# Patient Record
Sex: Male | Born: 1975
Health system: Southern US, Community
[De-identification: ages and names within clinical notes are randomized; demographics above are authoritative.]

## PROBLEM LIST (undated history)

## (undated) DIAGNOSIS — E7800 Pure hypercholesterolemia, unspecified: Secondary | ICD-10-CM

## (undated) DIAGNOSIS — R7303 Prediabetes: Secondary | ICD-10-CM

## (undated) HISTORY — PX: APPENDECTOMY: SHX54

---

## 2015-07-29 ENCOUNTER — Ambulatory Visit (INDEPENDENT_AMBULATORY_CARE_PROVIDER_SITE_OTHER): Payer: BLUE CROSS/BLUE SHIELD | Admitting: Family Medicine

## 2015-07-29 VITALS — BP 120/80 | HR 68 | Temp 98.2°F | Resp 16 | Ht 70.0 in | Wt 159.0 lb

## 2015-07-29 DIAGNOSIS — Z0000 Encounter for general adult medical examination without abnormal findings: Secondary | ICD-10-CM | POA: Diagnosis not present

## 2015-07-29 DIAGNOSIS — Z136 Encounter for screening for cardiovascular disorders: Secondary | ICD-10-CM

## 2015-07-29 DIAGNOSIS — Z1383 Encounter for screening for respiratory disorder NEC: Secondary | ICD-10-CM

## 2015-07-29 DIAGNOSIS — Z1389 Encounter for screening for other disorder: Secondary | ICD-10-CM

## 2015-07-29 DIAGNOSIS — E7800 Pure hypercholesterolemia, unspecified: Secondary | ICD-10-CM | POA: Diagnosis not present

## 2015-07-29 DIAGNOSIS — Z130 Encounter for screening for diseases of the blood and blood-forming organs and certain disorders involving the immune mechanism: Secondary | ICD-10-CM

## 2015-07-29 DIAGNOSIS — Z23 Encounter for immunization: Secondary | ICD-10-CM

## 2015-07-29 DIAGNOSIS — Z1329 Encounter for screening for other suspected endocrine disorder: Secondary | ICD-10-CM

## 2015-07-29 LAB — COMPREHENSIVE METABOLIC PANEL
ALT: 70 U/L — ABNORMAL HIGH (ref 9–46)
AST: 41 U/L — ABNORMAL HIGH (ref 10–40)
Albumin: 4 g/dL (ref 3.6–5.1)
Alkaline Phosphatase: 89 U/L (ref 40–115)
BUN: 12 mg/dL (ref 7–25)
CHLORIDE: 102 mmol/L (ref 98–110)
CO2: 28 mmol/L (ref 20–31)
Calcium: 9 mg/dL (ref 8.6–10.3)
Creat: 0.86 mg/dL (ref 0.60–1.35)
GLUCOSE: 92 mg/dL (ref 65–99)
POTASSIUM: 4.4 mmol/L (ref 3.5–5.3)
Sodium: 138 mmol/L (ref 135–146)
Total Bilirubin: 1.1 mg/dL (ref 0.2–1.2)
Total Protein: 7.3 g/dL (ref 6.1–8.1)

## 2015-07-29 LAB — CBC
HEMATOCRIT: 45.1 % (ref 38.5–50.0)
HEMOGLOBIN: 15.4 g/dL (ref 13.2–17.1)
MCH: 27.9 pg (ref 27.0–33.0)
MCHC: 34.1 g/dL (ref 32.0–36.0)
MCV: 81.7 fL (ref 80.0–100.0)
MPV: 10 fL (ref 7.5–12.5)
Platelets: 227 10*3/uL (ref 140–400)
RBC: 5.52 MIL/uL (ref 4.20–5.80)
RDW: 14 % (ref 11.0–15.0)
WBC: 4.5 10*3/uL (ref 3.8–10.8)

## 2015-07-29 LAB — LIPID PANEL
CHOL/HDL RATIO: 6.6 ratio — AB (ref <=5.0)
Cholesterol: 177 mg/dL (ref 125–200)
HDL: 27 mg/dL — AB (ref >=40)
LDL CALC: 113 mg/dL (ref <130)
TRIGLYCERIDES: 184 mg/dL — AB (ref <150)
VLDL: 37 mg/dL — AB (ref <30)

## 2015-07-29 LAB — TSH: TSH: 1.5 m[IU]/L (ref 0.40–4.50)

## 2015-07-29 NOTE — Progress Notes (Signed)
Subjective:  By signing my name below, I, Ralph Peters, attest that this documentation has been prepared under the direction and in the presence of Norberto SorensonEva Shaw, MD. Electronically Signed: Stann Oresung-Kai Peters, Scribe. 07/29/2015 , 11:14 AM .  Patient was seen in Room 12 .   Patient ID: Ralph Peters, male    DOB: 06/19/1975, 40 y.o.   MRN: [id number] Chief Complaint  Patient presents with  . bloodwork for cholesterol   HPI Ralph Peters is a 40 y.o. male who presents to [facility] for annual physical. He's fasting today.  He denies much exercising. He informs drinking about 12 beers a week, mostly over the weekend. He denies urinary symptoms, constipation, diarrhea, nausea, or abdominal pain.   He mentions having history of slightly elevated cholesterol in previous blood work.  His father has enlarged prostate.   History reviewed. No pertinent past medical history. Prior to Admission medications   Not on File   Allergies  Allergen Reactions  . Advil [Ibuprofen] Rash    Review of Systems  Constitutional: Negative for fever, chills and fatigue.  Gastrointestinal: Negative for nausea, vomiting, abdominal pain, diarrhea and constipation.  Endocrine: Negative for polyuria.  Genitourinary: Negative for dysuria, urgency, frequency and hematuria.       Objective:   Physical Exam  Constitutional: He is oriented to person, place, and time. He appears well-developed and well-nourished. No distress.  HENT:  Head: Normocephalic and atraumatic.  Right Ear: Tympanic membrane is retracted.  Left Ear: Tympanic membrane is retracted.  Nose: Nose normal.  Mouth/Throat: Oropharynx is clear and moist.  Eyes: EOM are normal. Pupils are equal, round, and reactive to light.  Neck: Neck supple. No thyromegaly present.  Cardiovascular: Normal rate.   Pulmonary/Chest: Effort normal. No respiratory distress.  Abdominal: Bowel sounds are normal.  Musculoskeletal: Normal range of motion.    Lymphadenopathy:    He has no cervical adenopathy.  Neurological: He is alert and oriented to person, place, and time.  Skin: Skin is warm and dry.  Psychiatric: He has a normal mood and affect. His behavior is normal.  Nursing note and vitals reviewed.   BP 120/80 mmHg  Pulse 68  Temp(Src) 98.2 F (36.8 C) (Oral)  Resp 16  Ht 5\' 10"  (1.778 m)  Wt 159 lb (72.122 kg)  BMI 22.81 kg/m2  SpO2 99%    Assessment & Plan:   1. Annual physical exam   2. Screening for cardiovascular, respiratory, and genitourinary diseases   3. Screening for deficiency anemia   4. Screening for thyroid disorder   5. Need for prophylactic vaccination with combined diphtheria-tetanus-pertussis (DTP) vaccine   6. Elevated cholesterol     Orders Placed This Encounter  Procedures  . Tdap vaccine greater than or equal to 7yo IM  . CBC  . Lipid panel    Order Specific Question:  Has the patient fasted?    Answer:  Yes  . Comprehensive metabolic panel    Order Specific Question:  Has the patient fasted?    Answer:  Yes  . TSH  . Care order/instruction    AVS - please print after vaccine is given  . POCT urinalysis dipstick     I personally performed the services described in this documentation, which was scribed in my presence. The recorded information has been reviewed and considered, and addended by me as needed.   Norberto SorensonEva Shaw, M.D.  [facility]  [place] [address] [place], [place] [address] [phone]  phone [phone] fax  08/12/2015 1:47 AM

## 2015-07-29 NOTE — Patient Instructions (Addendum)
   IF you received an x-ray today, you will receive an invoice from [facility]. Please contact  Radiology at [phone] with questions or concerns regarding your invoice.   IF you received labwork today, you will receive an invoice from [facility]. Please contact [facility] at [phone] with questions or concerns regarding your invoice.   Our billing staff will not be able to assist you with questions regarding bills from these companies.  You will be contacted with the lab results as soon as they are available. The fastest way to get your results is to activate your My Chart account. Instructions are located on the last page of this paperwork. If you have not heard from us regarding the results in 2 weeks, please contact this office.    Keeping you healthy  Get these tests  Blood pressure- Have your blood pressure checked once a year by your healthcare provider.  Normal blood pressure is 120/80.  Weight- Have your body mass index (BMI) calculated to screen for obesity.  BMI is a measure of body fat based on height and weight. You can also calculate your own BMI at www.nhlbisupport.com/bmi/.  Cholesterol- Have your cholesterol checked regularly starting at age 35, sooner may be necessary if you have diabetes, high blood pressure, if a family member developed heart diseases at an early age or if you smoke.   Chlamydia, HIV, and other sexual transmitted disease- Get screened each year until the age of 25 then within three months of each new sexual partner.  Diabetes- Have your blood sugar checked regularly if you have high blood pressure, high cholesterol, a family history of diabetes or if you are overweight.  Get these vaccines  Flu shot- Every fall.  Tetanus shot- Every 10 years.  Menactra- Single dose; prevents meningitis.  Take these steps  Don't smoke- If you do smoke, ask your healthcare provider about quitting. For tips on  how to quit, go to www.smokefree.gov or call 1-800-QUIT-NOW.  Be physically active- Exercise 5 days a week for at least 30 minutes.  If you are not already physically active start slow and gradually work up to 30 minutes of moderate physical activity.  Examples of moderate activity include walking briskly, mowing the yard, dancing, swimming bicycling, etc.  Eat a healthy diet- Eat a variety of healthy foods such as fruits, vegetables, low fat milk, low fat cheese, yogurt, lean meats, poultry, fish, beans, tofu, etc.  For more information on healthy eating, go to www.thenutritionsource.org  Drink alcohol in moderation- Limit alcohol intake two drinks or less a day.  Never drink and drive.  Dentist- Brush and floss teeth twice daily; visit your dentis twice a year.  Depression-Your emotional health is as important as your physical health.  If you're feeling down, losing interest in things you normally enjoy please talk with your healthcare provider.  Gun Safety- If you keep a gun in your home, keep it unloaded and with the safety lock on.  Bullets should be stored separately.  Helmet use- Always wear a helmet when riding a motorcycle, bicycle, rollerblading or skateboarding.  Safe sex- If you may be exposed to a sexually transmitted infection, use a condom  Seat belts- Seat bels can save your life; always wear one.  Smoke/Carbon Monoxide detectors- These detectors need to be installed on the appropriate level of your home.  Replace batteries at least once a year.  Skin Cancer- When out in the sun, cover up and use sunscreen SPF   15 or higher.  Violence- If anyone is threatening or hurting you, please tell your healthcare provider. 

## 2015-07-30 ENCOUNTER — Encounter: Payer: Self-pay | Admitting: Family Medicine

## 2017-03-11 ENCOUNTER — Emergency Department (HOSPITAL_BASED_OUTPATIENT_CLINIC_OR_DEPARTMENT_OTHER)
Admission: EM | Admit: 2017-03-11 | Discharge: 2017-03-11 | Disposition: A | Discharge status: Home / Self Care | Payer: BLUE CROSS/BLUE SHIELD | Attending: Emergency Medicine | Admitting: Emergency Medicine

## 2017-03-11 ENCOUNTER — Emergency Department (HOSPITAL_BASED_OUTPATIENT_CLINIC_OR_DEPARTMENT_OTHER): Payer: BLUE CROSS/BLUE SHIELD

## 2017-03-11 ENCOUNTER — Encounter (HOSPITAL_BASED_OUTPATIENT_CLINIC_OR_DEPARTMENT_OTHER): Payer: Self-pay | Admitting: Emergency Medicine

## 2017-03-11 DIAGNOSIS — M545 Low back pain, unspecified: Secondary | ICD-10-CM

## 2017-03-11 HISTORY — DX: Pure hypercholesterolemia, unspecified: E78.00

## 2017-03-11 MED ORDER — METHOCARBAMOL 500 MG PO TABS: 1000.0000 mg | ORAL_TABLET | Freq: Four times a day (QID) | ORAL | 0 refills | Status: DC

## 2017-03-11 MED ORDER — ACETAMINOPHEN 325 MG PO TABS
650.0000 mg | ORAL_TABLET | Freq: Once | ORAL | Status: AC
  Administered 2017-03-11: 650 mg via ORAL
  Filled 2017-03-11: qty 2

## 2017-03-11 MED FILL — METHOCARBAMOL 500 MG TABS: 500 | 3 days supply | Qty: 20 | Fill #0

## 2017-03-11 NOTE — ED Notes (Signed)
Patient transported to X-ray 

## 2017-03-11 NOTE — ED Triage Notes (Signed)
Pt c/o lower bilateral back pain that began yesterday and has since worsened. Pt denies incontinence, urinary symptoms, numbness, tingling, or weakness. Pt is able to ambulate but hurts.

## 2017-03-11 NOTE — Discharge Instructions (Signed)
Please read and follow all provided instructions.  Your diagnoses today include:  1. Acute midline low back pain without sciatica     Tests performed today include:  Vital signs - see below for your results today  X-ray of your back - no problems with the bones  Medications prescribed:   Robaxin (methocarbamol) - muscle relaxer medication  DO NOT drive or perform any activities that require you to be awake and alert because this medicine can make you drowsy.   Take any prescribed medications only as directed.  Home care instructions:   Follow any educational materials contained in this packet  Please rest, use ice or heat on your back for the next several days  Do not lift, push, pull anything more than 10 pounds for the next week  Follow-up instructions: Please follow-up with your primary care provider in the next 1 week for further evaluation of your symptoms.   Return instructions:  SEEK IMMEDIATE MEDICAL ATTENTION IF YOU HAVE:  New numbness, tingling, weakness, or problem with the use of your arms or legs  Severe back pain not relieved with medications  Loss control of your bowels or bladder  Increasing pain in any areas of the body (such as chest or abdominal pain)  Shortness of breath, dizziness, or fainting.   Worsening nausea (feeling sick to your stomach), vomiting, fever, or sweats  Any other emergent concerns regarding your health   Additional Information:  Your vital signs today were: BP 116/82    Pulse 60    Temp 98.1 F (36.7 C) (Oral)    Ht 5\' 9"  (1.753 m)    Wt 77.1 kg (170 lb)    SpO2 99%    BMI 25.10 kg/m  If your blood pressure (BP) was elevated above 135/85 this visit, please have this repeated by your doctor within one month. --------------

## 2017-03-11 NOTE — ED Notes (Signed)
Pt understood dc material. NAD noted. Script was sent to [facility].

## 2017-03-11 NOTE — ED Provider Notes (Signed)
[facility] EMERGENCY DEPARTMENT Provider Note   CSN: [id number] Arrival date & time: 03/11/17  [id number]     History   Chief Complaint Chief Complaint  Patient presents with  . Back Pain    HPI Ralph Peters is a 42 y.o. male.  Patient presents to the emergency department today complaint of back pain.  Patient states that the pain started about a week ago and was mild but worsened yesterday after working with heavy equipment.  Patient denies any falls or treatments prior to arrival.  Pain is sharp in the middle part of the lower back.  It is worse with movement.  Patient is able to walk.  He was unable to work today. Patient denies warning symptoms of back pain including: fecal incontinence, urinary retention or overflow incontinence, night sweats, waking from sleep with back pain, unexplained fevers or weight loss, h/o cancer, IVDU, recent trauma.  The onset of this condition was acute. The course is constant. Alleviating factors: none.        Past Medical History:  Diagnosis Date  . Hypercholesteremia     There are no active problems to display for this patient.   Past Surgical History:  Procedure Laterality Date  . APPENDECTOMY         Home Medications    Prior to Admission medications   Not on File    Family History No family history on file.  Social History Social History   Tobacco Use  . Smoking status: Never Smoker  Substance Use Topics  . Alcohol use: Not on file  . Drug use: Not on file     Allergies   Advil [ibuprofen]   Review of Systems Review of Systems  Constitutional: Negative for fever and unexpected weight change.  Gastrointestinal: Negative for constipation.       Neg for fecal incontinence  Genitourinary: Negative for difficulty urinating, flank pain and hematuria.       Negative for urinary incontinence or retention  Musculoskeletal: Positive for back pain.  Neurological: Negative for weakness and numbness.   Negative for saddle paresthesias      Physical Exam Updated Vital Signs BP 116/82   Pulse 60   Temp 98.1 F (36.7 C) (Oral)   Ht 5\' 9"  (1.753 m)   Wt 77.1 kg (170 lb)   SpO2 99%   BMI 25.10 kg/m   Physical Exam  Constitutional: He appears well-developed and well-nourished.  HENT:  Head: Normocephalic and atraumatic.  Eyes: Conjunctivae are normal.  Neck: Normal range of motion.  Abdominal: Soft. There is no tenderness. There is no CVA tenderness.  Musculoskeletal: Normal range of motion.       Right hip: He exhibits normal range of motion, normal strength and no tenderness.       Left hip: He exhibits normal range of motion, normal strength and no tenderness.       Cervical back: He exhibits normal range of motion, no tenderness and no bony tenderness.       Thoracic back: He exhibits normal range of motion, no tenderness and no bony tenderness.       Lumbar back: He exhibits tenderness and bony tenderness. He exhibits normal range of motion.  No step-off noted with palpation of spine.   Neurological: He is alert. He has normal reflexes. No sensory deficit. He exhibits normal muscle tone.  5/5 strength in entire lower extremities bilaterally. No sensation deficit.   Skin: Skin is warm and dry.  Psychiatric: He  has a normal mood and affect.  Nursing note and vitals reviewed.    ED Treatments / Results   Radiology Dg Lumbar Spine Complete  Result Date: 03/11/2017 CLINICAL DATA:  Acute lower back pain after injury at work. EXAM: LUMBAR SPINE - COMPLETE 4+ VIEW COMPARISON:  None. FINDINGS: There is no evidence of lumbar spine fracture. Alignment is normal. Intervertebral disc spaces are maintained. IMPRESSION: Normal lumbar spine. Electronically Signed   By: Lupita Raider, M.D.   On: 03/11/2017 10:04    Procedures Procedures (including critical care time)  Medications Ordered in ED Medications  acetaminophen (TYLENOL) tablet 650 mg (not administered)     Initial  Impression / Assessment and Plan / ED Course  I have reviewed the triage vital signs and the nursing notes.  Pertinent labs & imaging results that were available during my care of the patient were reviewed by me and considered in my medical decision making (see chart for details).     Patient seen and examined.  Patient's pain is most likely musculoskeletal in etiology given history and exam.  Patient seems to be very concerned that the pain "is in my bones".  Discussed utility of x-ray.  Patient would like reassurance that his bones look normal.  X-ray ordered on these grounds.  Vital signs reviewed and are as follows: BP 116/82   Pulse 60   Temp 98.1 F (36.7 C) (Oral)   Ht 5\' 9"  (1.753 m)   Wt 77.1 kg (170 lb)   SpO2 99%   BMI 25.10 kg/m   10:17 AM patient updated on negative x-ray results.  No red flag s/s of low back pain. Patient was counseled on back pain precautions and told to do activity as tolerated but do not lift, push, or pull heavy objects more than 10 pounds for the next week.  Patient counseled to use ice or heat on back for no longer than 15 minutes every hour.   Patient prescribed muscle relaxer and counseled on proper use of muscle relaxant medication.    Patient urged to follow-up with PCP if pain does not improve with treatment and rest or if pain becomes recurrent. Urged to return with worsening severe pain, loss of bowel or bladder control, trouble walking.   The patient verbalizes understanding and agrees with the plan.   Final Clinical Impressions(s) / ED Diagnoses   Final diagnoses:  Acute midline low back pain without sciatica   Patient with back pain, no radicular features. Imaging reassuring. No neurological deficits. Patient is ambulatory. No warning symptoms of back pain including: fecal incontinence, urinary retention or overflow incontinence, night sweats, waking from sleep with back pain, unexplained fevers or weight loss, h/o cancer, IVDU,  recent trauma. No concern for cauda equina, epidural abscess, or other serious cause of back pain. Conservative measures such as rest, ice/heat and pain medicine indicated with PCP follow-up if no improvement with conservative management.    ED Discharge Orders        Ordered    methocarbamol (ROBAXIN) 500 MG tablet  4 times daily     03/11/17 1011       Renne Crigler, PA-C 03/11/17 1017    Cathren Laine, MD 03/11/17 1530

## 2018-08-10 ENCOUNTER — Other Ambulatory Visit: Payer: BC Managed Care – PPO

## 2018-08-10 ENCOUNTER — Other Ambulatory Visit: Payer: Self-pay

## 2018-08-10 ENCOUNTER — Ambulatory Visit
Admission: EM | Admit: 2018-08-10 | Discharge: 2018-08-10 | Disposition: A | Discharge status: Home / Self Care | Payer: BC Managed Care – PPO | Attending: Emergency Medicine | Admitting: Emergency Medicine

## 2018-08-10 ENCOUNTER — Telehealth: Payer: Self-pay

## 2018-08-10 DIAGNOSIS — Z20822 Contact with and (suspected) exposure to covid-19: Secondary | ICD-10-CM

## 2018-08-10 DIAGNOSIS — R509 Fever, unspecified: Secondary | ICD-10-CM | POA: Diagnosis not present

## 2018-08-10 MED ORDER — ACETAMINOPHEN 325 MG PO TABS
975.0000 mg | ORAL_TABLET | Freq: Once | ORAL | Status: AC
  Administered 2018-08-10: 975 mg via ORAL

## 2018-08-10 NOTE — Telephone Encounter (Addendum)
Patient called and advised of the request for covid testing. Appointment scheduled for today at 1530 at [facility], advised of location and to wear a mask for everyone in the vehicle, he verbalized understanding. Order placed.   ----- Message from Rennis Harding, PA-C sent at 08/10/2018  2:36 PM EDT ----- Regarding: COVID TESTING NEEDED Fever and body aches for the past 1-2 days.  Fever in office of 102, 104 at home.  Denies sick exposure, covid exposure or recent travel.

## 2018-08-10 NOTE — ED Provider Notes (Signed)
[facility] CARE [facility]ENTER   [id number] 08/10/18 Arrival Time: 1405   CC: fever and body aches  SUBJECTIVE: History from: patient.  Ralph Peters is a 43 y.o. male hx significant for hypercholesteremia, who presents with fever, with tmax of 104, x 1 day and body aches x 2 days.  Denies sick exposure to COVID, flu or strep.  Denies recent travel.  Works as a Brewing technologist.  Has tried tylenol with relief.  Reports previous symptoms in the past related to allergies, but has not experienced body aches with allergies. Complains of associated HA.   Denies congestion, rhinorrhea, sore throat, cough, SOB, wheezing, chest pain, chest pressure, nausea, vomiting, changes in bowel or bladder habits.    ROS: As per HPI.  Past Medical History:  Diagnosis Date  . Hypercholesteremia    Past Surgical History:  Procedure Laterality Date  . APPENDECTOMY     Allergies  Allergen Reactions  . Advil [Ibuprofen] Rash   No current facility-administered medications on file prior to encounter.    No current outpatient medications on file prior to encounter.   Social History   Socioeconomic History  . Marital status: Married    Spouse name: Not on file  . Number of children: Not on file  . Years of education: Not on file  . Highest education level: Not on file  Occupational History  . Not on file  Social Needs  . Financial resource strain: Not on file  . Food insecurity:    Worry: Not on file    Inability: Not on file  . Transportation needs:    Medical: Not on file    Non-medical: Not on file  Tobacco Use  . Smoking status: Never Smoker  Substance and Sexual Activity  . Alcohol use: Not on file  . Drug use: Not on file  . Sexual activity: Not on file  Lifestyle  . Physical activity:    Days per week: Not on file    Minutes per session: Not on file  . Stress: Not on file  Relationships  . Social connections:    Talks on phone: Not on file    Gets together: Not on file   Attends religious service: Not on file    Active member of club or organization: Not on file    Attends meetings of clubs or organizations: Not on file    Relationship status: Not on file  . Intimate partner violence:    Fear of current or ex partner: Not on file    Emotionally abused: Not on file    Physically abused: Not on file    Forced sexual activity: Not on file  Other Topics Concern  . Not on file  Social History Narrative  . Not on file   History reviewed. No pertinent family history.  OBJECTIVE:  Vitals:   08/10/18 1420  BP: 137/88  Pulse: (!) 116  Resp: 20  Temp: (!) 102.8 F (39.3 C)  SpO2: 94%     General appearance: alert; appears fatigued, but nontoxic; speaking in full sentences and tolerating own secretions HEENT: NCAT; Ears: EACs clear, TMs pearly gray; Eyes: PERRL.  EOM grossly intact. Nose: nares patent without rhinorrhea, Throat: oropharynx clear, tonsils non erythematous or enlarged, uvula midline  Neck: supple without LAD Lungs: unlabored respirations, symmetrical air entry; cough: absent; no respiratory distress; CTAB Heart: Tachcardic.  Radial pulses 2+ symmetrical bilaterally Skin: warm and dry Psychological: alert and cooperative; normal mood and affect  ASSESSMENT & PLAN:  1. Fever, unspecified   2. Suspected Covid-19 Virus Infection     Meds ordered this encounter  Medications  . acetaminophen (TYLENOL) tablet 975 mg   Tylenol given in office COVID testing ordered.  Outpatient center will contact you regarding your appointment  In the meantime: You should remain isolated in your home for 7 days from symptom onset AND greater than 72 hours after symptoms resolution (absence of fever without the use of fever-reducing medication and improvement in respiratory symptoms), whichever is longer Get plenty of rest and push fluids You may use OTC zyrtec and/or flonase as needed for congestion and/ or runny nose Take OTC tylenol as needed for  fever, body aches, and/or chills Call or go to the ED if you have any new or worsening symptoms such as persistent fever, cough, shortness of breath, chest tightness, chest pain, turning blue, changes in mental status, etc...   Reviewed expectations re: course of current medical issues. Questions answered. Outlined signs and symptoms indicating need for more acute intervention. Patient verbalized understanding. After Visit Summary given.         Rennis HardingWurst, Mortimer Bair, PA-C 08/10/18 1439

## 2018-08-10 NOTE — ED Triage Notes (Signed)
Pt went to health dept to be seen for fever and was advised to come here, pt reports 104 fever at home, with body aches and headache, no cough or sore throat

## 2018-08-10 NOTE — Discharge Instructions (Signed)
Tylenol given in office COVID testing ordered.  Outpatient center will contact you regarding your appointment  In the meantime: You should remain isolated in your home for 7 days from symptom onset AND greater than 72 hours after symptoms resolution (absence of fever without the use of fever-reducing medication and improvement in respiratory symptoms), whichever is longer Get plenty of rest and push fluids You may use OTC zyrtec and/or flonase as needed for congestion and/ or runny nose Take OTC tylenol as needed for fever, body aches, and/or chills Call or go to the ED if you have any new or worsening symptoms such as persistent fever, cough, shortness of breath, chest tightness, chest pain, turning blue, changes in mental status, etc..Marland Kitchen

## 2018-08-12 LAB — NOVEL CORONAVIRUS, NAA: SARS-CoV-2, NAA: NOT DETECTED

## 2019-01-20 IMAGING — CR DG LUMBAR SPINE COMPLETE 4+V
5 series · 5 of 5 positions shown · non-contrast
Comparison: None.

CLINICAL DATA: Acute lower back pain after injury at work.

EXAM:
LUMBAR SPINE - COMPLETE 4+ VIEW

[t l-spine a.p.]
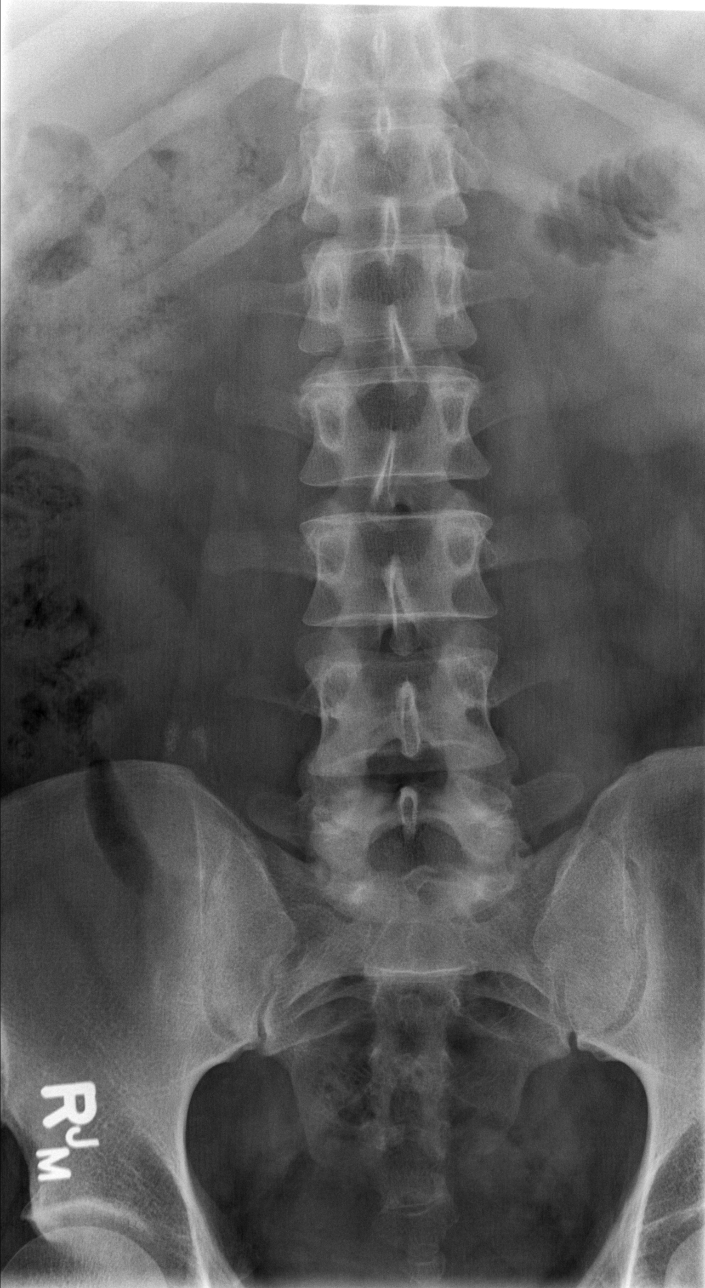

[t l-spine oblique exposure (1 of 2)]
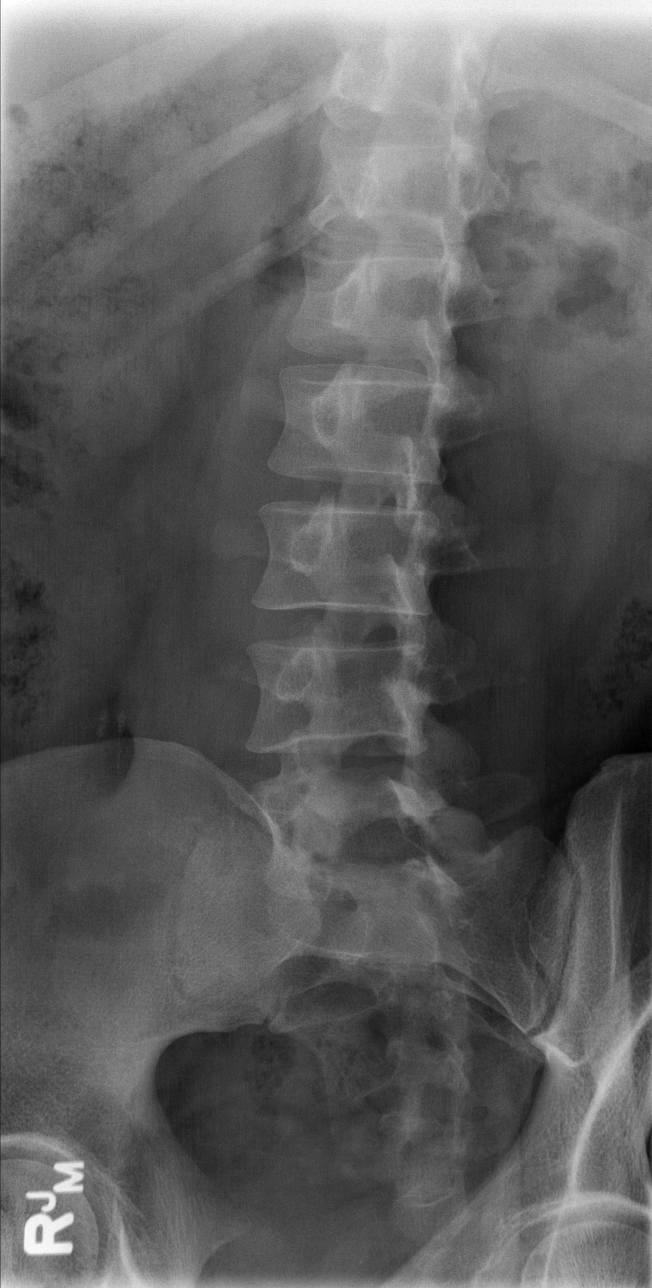

[t l-spine oblique exposure (2 of 2)]
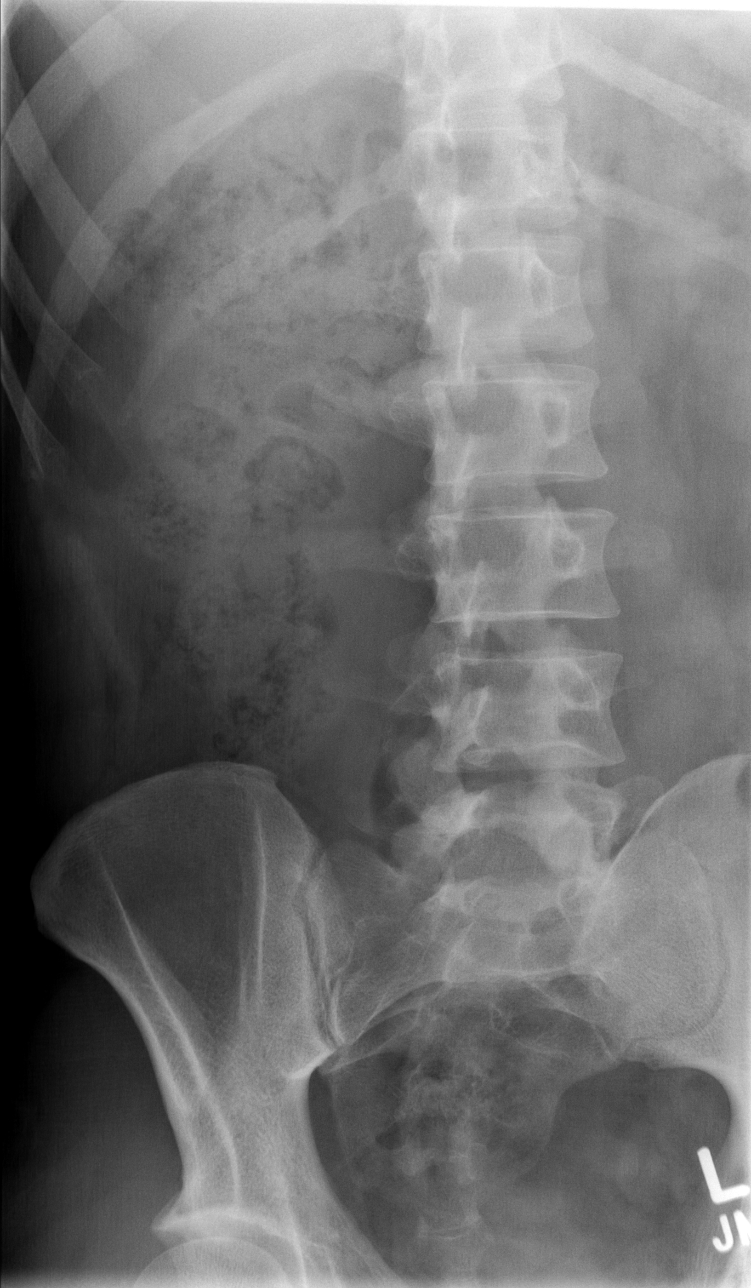

[t l-spine lat]
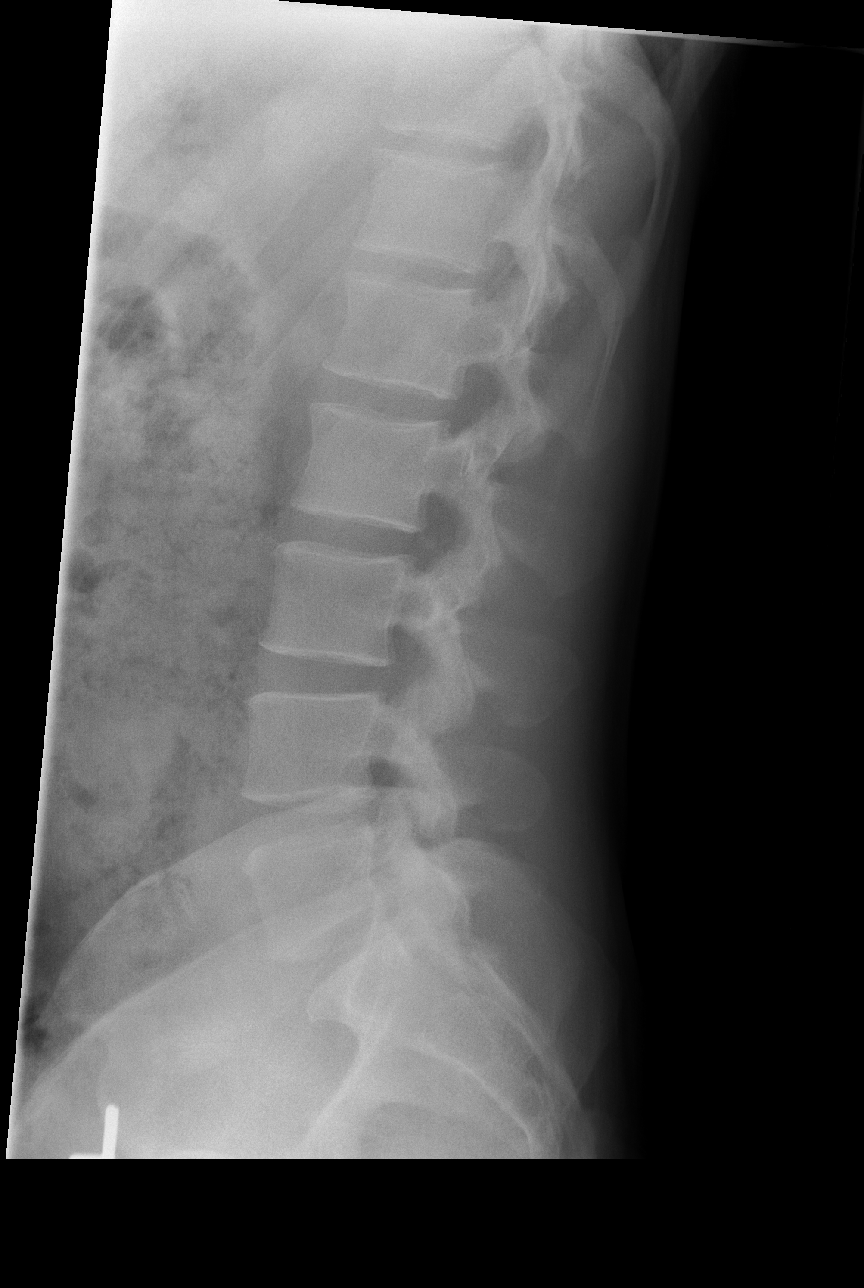

[t l-spine l5-s1 spot]
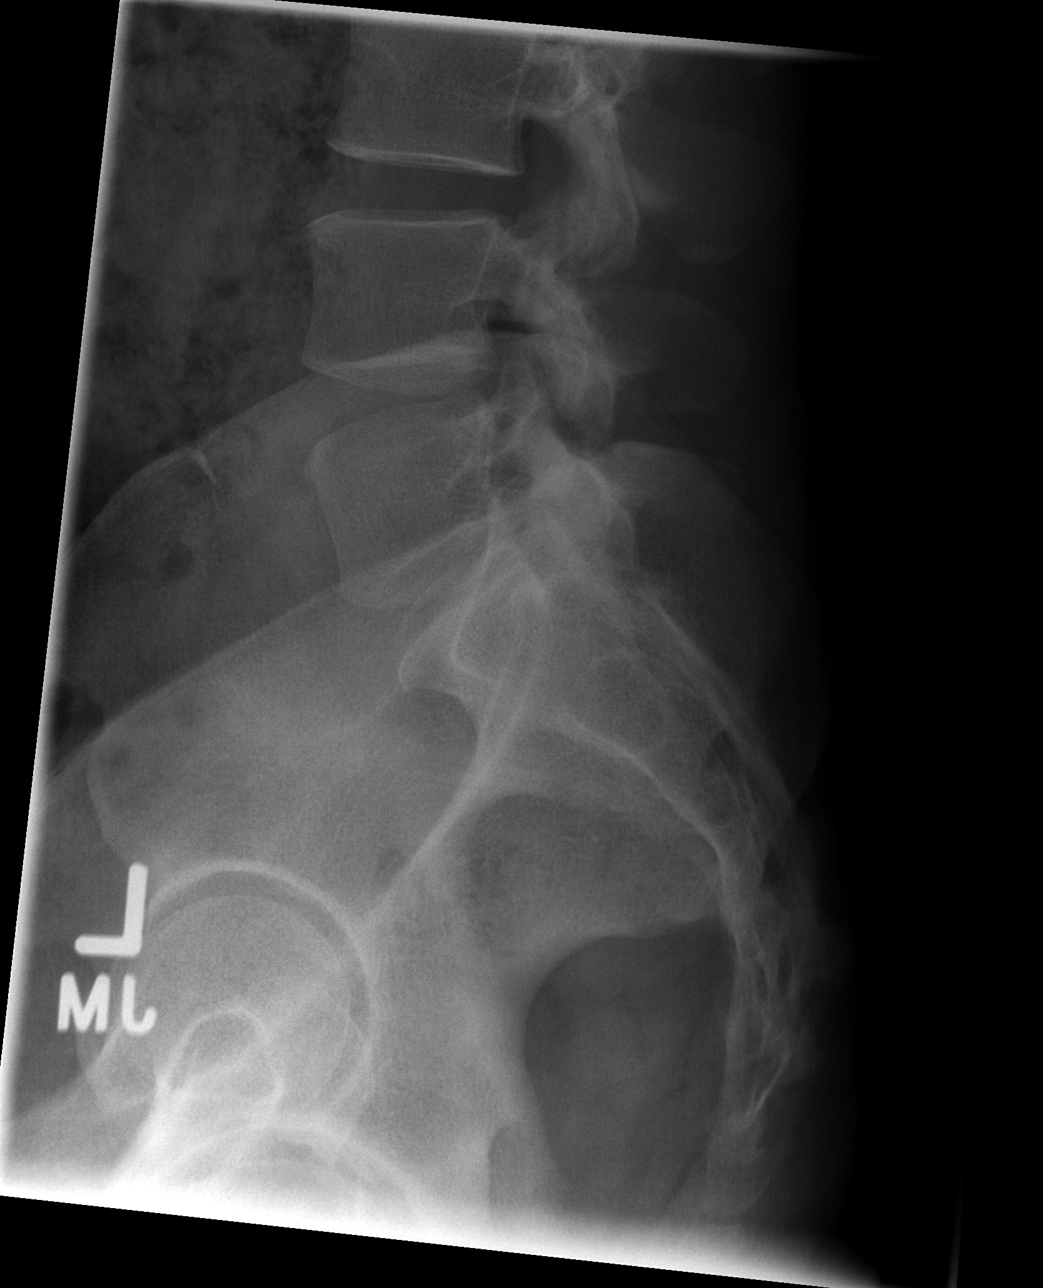

[5 of 5 positions shown; findings below may reference images not displayed]

FINDINGS: There is no evidence of lumbar spine fracture. Alignment is normal.
Intervertebral disc spaces are maintained.
IMPRESSION: Normal lumbar spine.

## 2021-06-18 DIAGNOSIS — H6501 Acute serous otitis media, right ear: Secondary | ICD-10-CM | POA: Diagnosis not present

## 2021-06-18 DIAGNOSIS — R7303 Prediabetes: Secondary | ICD-10-CM | POA: Diagnosis not present

## 2021-07-07 DIAGNOSIS — Z0000 Encounter for general adult medical examination without abnormal findings: Secondary | ICD-10-CM | POA: Diagnosis not present

## 2021-07-07 DIAGNOSIS — Z125 Encounter for screening for malignant neoplasm of prostate: Secondary | ICD-10-CM | POA: Diagnosis not present

## 2021-07-07 DIAGNOSIS — Z1211 Encounter for screening for malignant neoplasm of colon: Secondary | ICD-10-CM | POA: Diagnosis not present

## 2021-07-21 DIAGNOSIS — Z889 Allergy status to unspecified drugs, medicaments and biological substances status: Secondary | ICD-10-CM | POA: Diagnosis not present

## 2021-07-21 DIAGNOSIS — R053 Chronic cough: Secondary | ICD-10-CM | POA: Diagnosis not present

## 2021-07-21 DIAGNOSIS — J301 Allergic rhinitis due to pollen: Secondary | ICD-10-CM | POA: Diagnosis not present

## 2021-07-21 DIAGNOSIS — J3089 Other allergic rhinitis: Secondary | ICD-10-CM | POA: Diagnosis not present

## 2021-07-21 DIAGNOSIS — R0602 Shortness of breath: Secondary | ICD-10-CM | POA: Diagnosis not present

## 2022-02-20 DIAGNOSIS — R7303 Prediabetes: Secondary | ICD-10-CM | POA: Diagnosis not present

## 2022-02-21 DIAGNOSIS — R7303 Prediabetes: Secondary | ICD-10-CM | POA: Diagnosis not present

## 2022-03-06 DIAGNOSIS — E119 Type 2 diabetes mellitus without complications: Secondary | ICD-10-CM | POA: Diagnosis not present

## 2022-03-14 DIAGNOSIS — B351 Tinea unguium: Secondary | ICD-10-CM | POA: Diagnosis not present

## 2022-11-10 DIAGNOSIS — M25561 Pain in right knee: Secondary | ICD-10-CM | POA: Diagnosis not present

## 2022-12-04 DIAGNOSIS — M25461 Effusion, right knee: Secondary | ICD-10-CM | POA: Diagnosis not present

## 2022-12-04 DIAGNOSIS — S83242A Other tear of medial meniscus, current injury, left knee, initial encounter: Secondary | ICD-10-CM | POA: Diagnosis not present

## 2022-12-04 DIAGNOSIS — M25561 Pain in right knee: Secondary | ICD-10-CM | POA: Diagnosis not present

## 2023-01-08 DIAGNOSIS — M25561 Pain in right knee: Secondary | ICD-10-CM | POA: Diagnosis not present

## 2023-01-14 DIAGNOSIS — E7849 Other hyperlipidemia: Secondary | ICD-10-CM | POA: Diagnosis not present

## 2023-01-14 DIAGNOSIS — R202 Paresthesia of skin: Secondary | ICD-10-CM | POA: Diagnosis not present

## 2023-01-14 DIAGNOSIS — E663 Overweight: Secondary | ICD-10-CM | POA: Diagnosis not present

## 2023-01-14 DIAGNOSIS — Z0000 Encounter for general adult medical examination without abnormal findings: Secondary | ICD-10-CM | POA: Diagnosis not present

## 2023-01-14 DIAGNOSIS — Z789 Other specified health status: Secondary | ICD-10-CM | POA: Diagnosis not present

## 2023-01-14 DIAGNOSIS — R7303 Prediabetes: Secondary | ICD-10-CM | POA: Diagnosis not present

## 2023-01-29 DIAGNOSIS — M25561 Pain in right knee: Secondary | ICD-10-CM | POA: Diagnosis not present

## 2023-02-08 DIAGNOSIS — M25561 Pain in right knee: Secondary | ICD-10-CM | POA: Diagnosis not present

## 2023-02-25 ENCOUNTER — Other Ambulatory Visit: Payer: Self-pay

## 2023-02-25 ENCOUNTER — Encounter (HOSPITAL_BASED_OUTPATIENT_CLINIC_OR_DEPARTMENT_OTHER): Payer: Self-pay | Admitting: Orthopedic Surgery

## 2023-02-25 DIAGNOSIS — S83241A Other tear of medial meniscus, current injury, right knee, initial encounter: Secondary | ICD-10-CM | POA: Diagnosis not present

## 2023-03-08 ENCOUNTER — Encounter (HOSPITAL_BASED_OUTPATIENT_CLINIC_OR_DEPARTMENT_OTHER)
Admission: RE | Admit: 2023-03-08 | Discharge: 2023-03-08 | Disposition: A | Discharge status: Home / Self Care | Payer: BC Managed Care – PPO | Source: Ambulatory Visit | Attending: Orthopedic Surgery | Admitting: Orthopedic Surgery

## 2023-03-08 DIAGNOSIS — R7303 Prediabetes: Secondary | ICD-10-CM | POA: Diagnosis not present

## 2023-03-08 DIAGNOSIS — Z01818 Encounter for other preprocedural examination: Secondary | ICD-10-CM | POA: Insufficient documentation

## 2023-03-08 LAB — BASIC METABOLIC PANEL
Anion gap: 9 (ref 5–15)
BUN: 14 mg/dL (ref 6–20)
CO2: 25 mmol/L (ref 22–32)
Calcium: 9.1 mg/dL (ref 8.9–10.3)
Chloride: 102 mmol/L (ref 98–111)
Creatinine, Ser: 0.81 mg/dL (ref 0.61–1.24)
GFR, Estimated: >60 mL/min (ref >60)
Glucose, Bld: 98 mg/dL (ref 70–99)
Potassium: 4.1 mmol/L (ref 3.5–5.1)
Sodium: 136 mmol/L (ref 135–145)

## 2023-03-08 NOTE — Progress Notes (Signed)

## 2023-03-12 ENCOUNTER — Ambulatory Visit (HOSPITAL_BASED_OUTPATIENT_CLINIC_OR_DEPARTMENT_OTHER): Payer: BC Managed Care – PPO | Admitting: Anesthesiology

## 2023-03-12 ENCOUNTER — Encounter (HOSPITAL_BASED_OUTPATIENT_CLINIC_OR_DEPARTMENT_OTHER): Payer: Self-pay | Admitting: Orthopedic Surgery

## 2023-03-12 ENCOUNTER — Other Ambulatory Visit: Payer: Self-pay

## 2023-03-12 ENCOUNTER — Encounter (HOSPITAL_BASED_OUTPATIENT_CLINIC_OR_DEPARTMENT_OTHER)
Admission: RE | Disposition: A | Discharge status: Home / Self Care | Payer: Self-pay | Source: Home / Self Care | Attending: Orthopedic Surgery

## 2023-03-12 ENCOUNTER — Ambulatory Visit (HOSPITAL_BASED_OUTPATIENT_CLINIC_OR_DEPARTMENT_OTHER)
Admission: RE | Admit: 2023-03-12 | Discharge: 2023-03-12 | Disposition: A | Discharge status: Home / Self Care | Payer: BC Managed Care – PPO | Attending: Orthopedic Surgery | Admitting: Orthopedic Surgery

## 2023-03-12 DIAGNOSIS — Z7984 Long term (current) use of oral hypoglycemic drugs: Secondary | ICD-10-CM | POA: Insufficient documentation

## 2023-03-12 DIAGNOSIS — M6588 Other synovitis and tenosynovitis, other site: Secondary | ICD-10-CM | POA: Insufficient documentation

## 2023-03-12 DIAGNOSIS — S83241A Other tear of medial meniscus, current injury, right knee, initial encounter: Secondary | ICD-10-CM | POA: Insufficient documentation

## 2023-03-12 DIAGNOSIS — S83231A Complex tear of medial meniscus, current injury, right knee, initial encounter: Secondary | ICD-10-CM | POA: Diagnosis not present

## 2023-03-12 DIAGNOSIS — R7303 Prediabetes: Secondary | ICD-10-CM | POA: Diagnosis not present

## 2023-03-12 DIAGNOSIS — X58XXXA Exposure to other specified factors, initial encounter: Secondary | ICD-10-CM | POA: Diagnosis not present

## 2023-03-12 HISTORY — DX: Prediabetes: R73.03

## 2023-03-12 HISTORY — PX: KNEE ARTHROSCOPY WITH MEDIAL MENISECTOMY: SHX5651

## 2023-03-12 LAB — GLUCOSE, CAPILLARY
Glucose-Capillary: 102 mg/dL — ABNORMAL HIGH (ref 70–99)
Glucose-Capillary: 116 mg/dL — ABNORMAL HIGH (ref 70–99)

## 2023-03-12 SURGERY — ARTHROSCOPY, KNEE, WITH MEDIAL MENISCECTOMY
Anesthesia: General | Site: Knee | Laterality: Right

## 2023-03-12 MED ORDER — MIDAZOLAM HCL 2 MG/2ML IJ SOLN
INTRAMUSCULAR | Status: AC
Start: 2023-03-12 — End: ?
  Filled 2023-03-12: qty 2

## 2023-03-12 MED ORDER — ONDANSETRON 4 MG PO TBDP: 4.0000 mg | ORAL_TABLET | Freq: Three times a day (TID) | ORAL | 0 refills | Status: AC | PRN

## 2023-03-12 MED ORDER — SODIUM CHLORIDE 0.9 % IV SOLN: INTRAVENOUS | Status: DC | PRN

## 2023-03-12 MED ORDER — FENTANYL CITRATE (PF) 100 MCG/2ML IJ SOLN
INTRAMUSCULAR | Status: AC
  Filled 2023-03-12: qty 2

## 2023-03-12 MED ORDER — OXYCODONE HCL 5 MG PO TABS: 5.0000 mg | ORAL_TABLET | Freq: Once | ORAL | Status: DC

## 2023-03-12 MED ORDER — DEXAMETHASONE SODIUM PHOSPHATE 10 MG/ML IJ SOLN
INTRAMUSCULAR | Status: DC | PRN
  Administered 2023-03-12: 4 mg via INTRAVENOUS

## 2023-03-12 MED ORDER — PROPOFOL 10 MG/ML IV BOLUS
INTRAVENOUS | Status: AC
  Filled 2023-03-12: qty 20

## 2023-03-12 MED ORDER — FENTANYL CITRATE (PF) 100 MCG/2ML IJ SOLN
INTRAMUSCULAR | Status: DC | PRN
  Administered 2023-03-12 (×2): 50 ug via INTRAVENOUS

## 2023-03-12 MED ORDER — BUPIVACAINE HCL 0.25 % IJ SOLN
INTRAMUSCULAR | Status: DC | PRN
  Administered 2023-03-12: 15 mL

## 2023-03-12 MED ORDER — LIDOCAINE 2% (20 MG/ML) 5 ML SYRINGE
INTRAMUSCULAR | Status: AC
  Filled 2023-03-12: qty 5

## 2023-03-12 MED ORDER — LACTATED RINGERS IV SOLN: INTRAVENOUS | Status: DC

## 2023-03-12 MED ORDER — MIDAZOLAM HCL 5 MG/5ML IJ SOLN
INTRAMUSCULAR | Status: DC | PRN
  Administered 2023-03-12: 2 mg via INTRAVENOUS

## 2023-03-12 MED ORDER — ONDANSETRON HCL 4 MG/2ML IJ SOLN
INTRAMUSCULAR | Status: DC | PRN
  Administered 2023-03-12: 4 mg via INTRAVENOUS

## 2023-03-12 MED ORDER — CEFAZOLIN SODIUM-DEXTROSE 2-4 GM/100ML-% IV SOLN
INTRAVENOUS | Status: AC
  Filled 2023-03-12: qty 100

## 2023-03-12 MED ORDER — HYDROCODONE-ACETAMINOPHEN 5-325 MG PO TABS: 1.0000 | ORAL_TABLET | ORAL | 0 refills | Status: AC | PRN

## 2023-03-12 MED ORDER — CEFAZOLIN SODIUM-DEXTROSE 2-4 GM/100ML-% IV SOLN
2.0000 g | INTRAVENOUS | Status: AC
  Administered 2023-03-12: 2 g via INTRAVENOUS

## 2023-03-12 MED ORDER — LIDOCAINE HCL (CARDIAC) PF 100 MG/5ML IV SOSY
PREFILLED_SYRINGE | INTRAVENOUS | Status: DC | PRN
  Administered 2023-03-12: 80 mg via INTRAVENOUS

## 2023-03-12 MED ORDER — DEXAMETHASONE SODIUM PHOSPHATE 10 MG/ML IJ SOLN
INTRAMUSCULAR | Status: AC
Start: 2023-03-12 — End: ?
  Filled 2023-03-12: qty 1

## 2023-03-12 MED ORDER — AMISULPRIDE (ANTIEMETIC) 5 MG/2ML IV SOLN: 10.0000 mg | Freq: Once | INTRAVENOUS | Status: DC | PRN

## 2023-03-12 MED ORDER — PROPOFOL 10 MG/ML IV BOLUS
INTRAVENOUS | Status: DC | PRN
  Administered 2023-03-12: 180 mg via INTRAVENOUS

## 2023-03-12 MED ORDER — SODIUM CHLORIDE 0.9 % IR SOLN
Status: DC | PRN
  Administered 2023-03-12: 3000 mL

## 2023-03-12 MED ORDER — FENTANYL CITRATE (PF) 100 MCG/2ML IJ SOLN
25.0000 ug | INTRAMUSCULAR | Status: DC | PRN
  Administered 2023-03-12: 50 ug via INTRAVENOUS
  Administered 2023-03-12: 25 ug via INTRAVENOUS

## 2023-03-12 MED ORDER — ONDANSETRON HCL 4 MG/2ML IJ SOLN
INTRAMUSCULAR | Status: AC
  Filled 2023-03-12: qty 2

## 2023-03-12 MED ORDER — ONDANSETRON HCL 4 MG/2ML IJ SOLN: 4.0000 mg | Freq: Once | INTRAMUSCULAR | Status: DC | PRN

## 2023-03-12 MED ORDER — BUPIVACAINE HCL (PF) 0.25 % IJ SOLN
INTRAMUSCULAR | Status: AC
  Filled 2023-03-12: qty 30

## 2023-03-12 SURGICAL SUPPLY — 23 items
BLADE SHAVER TORPEDO 4X13 (MISCELLANEOUS) ×1 IMPLANT
BNDG ELASTIC 6INX 5YD STR LF (GAUZE/BANDAGES/DRESSINGS) ×1 IMPLANT
CLSR STERI-STRIP ANTIMIC 1/2X4 (GAUZE/BANDAGES/DRESSINGS) ×1 IMPLANT
DRAPE U-SHAPE 47X51 STRL (DRAPES) ×1 IMPLANT
DRAPE-T ARTHROSCOPY W/POUCH (DRAPES) ×1 IMPLANT
DURAPREP 26ML APPLICATOR (WOUND CARE) ×1 IMPLANT
GAUZE PAD ABD 8X10 STRL (GAUZE/BANDAGES/DRESSINGS) ×1 IMPLANT
GAUZE SPONGE 4X4 12PLY STRL (GAUZE/BANDAGES/DRESSINGS) ×1 IMPLANT
GAUZE XEROFORM 1X8 LF (GAUZE/BANDAGES/DRESSINGS) ×1 IMPLANT
GLOVE BIO SURGEON STRL SZ7.5 (GLOVE) ×2 IMPLANT
GLOVE BIOGEL PI IND STRL 8 (GLOVE) ×2 IMPLANT
GOWN STRL REUS W/ TWL LRG LVL3 (GOWN DISPOSABLE) ×1 IMPLANT
GOWN STRL REUS W/TWL XL LVL3 (GOWN DISPOSABLE) ×2 IMPLANT
MANIFOLD NEPTUNE II (INSTRUMENTS) ×1 IMPLANT
PACK ARTHROSCOPY DSU (CUSTOM PROCEDURE TRAY) ×1 IMPLANT
PACK BASIN DAY SURGERY FS (CUSTOM PROCEDURE TRAY) ×1 IMPLANT
SUT ETHILON 4 0 PS 2 18 (SUTURE) IMPLANT
SUT MNCRL AB 3-0 PS2 18 (SUTURE) ×1 IMPLANT
TOWEL GREEN STERILE FF (TOWEL DISPOSABLE) ×1 IMPLANT
TUBE CONNECTING 20X1/4 (TUBING) IMPLANT
TUBING ARTHROSCOPY IRRIG 16FT (MISCELLANEOUS) ×1 IMPLANT
WAND ABLATOR APOLLO I90 (BUR) IMPLANT
WRAP KNEE MAXI GEL POST OP (GAUZE/BANDAGES/DRESSINGS) IMPLANT

## 2023-03-12 NOTE — Anesthesia Preprocedure Evaluation (Addendum)
 Anesthesia Evaluation  Patient identified by MRN, date of birth, ID band Patient awake    Reviewed: Allergy & Precautions, NPO status , Patient's Chart, lab work & pertinent test results  History of Anesthesia Complications Negative for: history of anesthetic complications  Airway Mallampati: II  TM Distance: >3 FB Neck ROM: Full    Dental no notable dental hx.    Pulmonary neg pulmonary ROS   Pulmonary exam normal        Cardiovascular negative cardio ROS Normal cardiovascular exam     Neuro/Psych negative neurological ROS     GI/Hepatic negative GI ROS, Neg liver ROS,,,  Endo/Other  Oral Hypoglycemic Agents    Renal/GU negative Renal ROS  negative genitourinary   Musculoskeletal negative musculoskeletal ROS (+)    Abdominal   Peds  Hematology negative hematology ROS (+)   Anesthesia Other Findings Right knee medial meniscus tear  Reproductive/Obstetrics                             Anesthesia Physical Anesthesia Plan  ASA: 1  Anesthesia Plan: General   Post-op Pain Management: Tylenol  PO (pre-op)*   Induction: Intravenous  PONV Risk Score and Plan: 2 and Treatment may vary due to age or medical condition, Ondansetron , Dexamethasone  and Midazolam   Airway Management Planned: LMA  Additional Equipment: None  Intra-op Plan:   Post-operative Plan: Extubation in OR  Informed Consent: I have reviewed the patients History and Physical, chart, labs and discussed the procedure including the risks, benefits and alternatives for the proposed anesthesia with the patient or authorized representative who has indicated his/her understanding and acceptance.     Interpreter used for interview  Plan Discussed with: CRNA  Anesthesia Plan Comments:        Anesthesia Quick Evaluation

## 2023-03-12 NOTE — H&P (Signed)
   ORTHOPAEDIC H and P  REQUESTING PHYSICIAN: Sharl Selinda Dover, MD  PCP:  Patient, No Pcp Per  Chief Complaint: Right knee pain  HPI: Ralph Peters is a 48 y.o. male who complains of right knee pain and weakness.  Here today for arthroscopic assisted right knee surgery with partial medial meniscectomy.  No new complaints today.  Past Medical History:  Diagnosis Date   Hypercholesteremia    Pre-diabetes    Past Surgical History:  Procedure Laterality Date   APPENDECTOMY     Social History   Socioeconomic History   Marital status: Married    Spouse name: Not on file   Number of children: Not on file   Years of education: Not on file   Highest education level: Not on file  Occupational History   Not on file  Tobacco Use   Smoking status: Never   Smokeless tobacco: Not on file  Vaping Use   Vaping status: Never Used  Substance and Sexual Activity   Alcohol use: Yes    Comment: rare   Drug use: Never   Sexual activity: Never  Other Topics Concern   Not on file  Social History Narrative   Not on file   Social Drivers of Health   Financial Resource Strain: Not on file  Food Insecurity: Not on file  Transportation Needs: Not on file  Physical Activity: Not on file  Stress: Not on file  Social Connections: Not on file   History reviewed. No pertinent family history. Allergies  Allergen Reactions   Advil [Ibuprofen] Rash   Prior to Admission medications   Medication Sig Start Date End Date Taking? Authorizing Provider  metFORMIN (GLUCOPHAGE) 500 MG tablet Take 500 mg by mouth 2 (two) times daily with a meal.   Yes [provider]   No results found.  Positive ROS: All other systems have been reviewed and were otherwise negative with the exception of those mentioned in the HPI and as above.  Physical Exam: General: Alert, no acute distress Cardiovascular: No pedal edema Respiratory: No cyanosis, no use of accessory musculature GI: No  organomegaly, abdomen is soft and non-tender Skin: No lesions in the area of chief complaint Neurologic: Sensation intact distally Psychiatric: Patient is competent for consent with normal mood and affect Lymphatic: No axillary or cervical lymphadenopathy  MUSCULOSKELETAL: Right lower extremity is warm and well-perfused with no open wounds or lesions.  Neurovascular intact.  Assessment: Right knee medial meniscus tear, initial encounter.  Plan: Plan to proceed today with arthroscopy of the right knee and partial medial meniscectomy.  We again discussed the risk and benefits of the procedure which include but not limited to bleeding, infection, damage to surrounding nerves vessels, stiffness potential development of arthritis, recurrent tear, need for outpatient therapy as well as the risk of anesthesia.  He has provided informed consent.  Plan to dc home post op    Selinda Dover Sharl, MD Cell [phone]    03/12/2023 9:28 AM

## 2023-03-12 NOTE — Brief Op Note (Signed)
 03/12/2023  12:26 PM  PATIENT:  Ksean Vale  48 y.o. male  PRE-OPERATIVE DIAGNOSIS:  Right knee medial meniscus tear  POST-OPERATIVE DIAGNOSIS:  Right knee medial meniscus tear  PROCEDURE:  Procedure(s) with comments: KNEE ARTHROSCOPY WITH PARTIAL MEDIAL MENISECTOMY (Right) - 44  SURGEON:  Surgeons and Role:    DEWAINE Gosling, Selinda Dover, MD - Primary    ASSISTANTS: Magdalene Fireman, RNFA   ANESTHESIA:   local and general  EBL:  5cc  BLOOD ADMINISTERED:none  DRAINS: none   LOCAL MEDICATIONS USED:  MARCAINE      SPECIMEN:  No Specimen  DISPOSITION OF SPECIMEN:  N/A  COUNTS:  YES  TOURNIQUET:  * No tourniquets in log *  DICTATION: .Note written in EPIC  PLAN OF CARE: Discharge to home after PACU  PATIENT DISPOSITION:  PACU - hemodynamically stable.   Delay start of Pharmacological VTE agent (>24hrs) due to surgical blood loss or risk of bleeding: not applicable

## 2023-03-12 NOTE — Op Note (Signed)
 Surgery Date: 03/12/2023  Surgeon(s): Sharl Selinda Dover, MD  Assistant: Christopher Fireman, RNFA  Assistant attestation: Fireman RNFA present for the entire procedure.  ANESTHESIA:  general with Marcaine  local  FLUIDS: Per anesthesia record.   ESTIMATED BLOOD LOSS: minimal  PREOPERATIVE DIAGNOSES:  1. Right knee medial meniscus tear 2.  Right knee synovitis  POSTOPERATIVE DIAGNOSES:  same  PROCEDURES PERFORMED:  1. Right knee arthroscopy with limited synovectomy 2. Right knee arthroscopy with arthroscopic partial medial meniscectomy  DESCRIPTION OF PROCEDURE: Mr. Ralph Peters is a 48 y.o.-year-old male with right knee medial meniscus tear. Plans are to proceed with partial medial meniscectomy and diagnostic arthroscopy with debridement as indicated. Full discussion held regarding risks benefits alternatives and complications related surgical intervention. Conservative care options reviewed. All questions answered.  The patient was identified in the preoperative holding area and the operative extremity was marked. The patient was brought to the operating room and transferred to operating table in a supine position. Satisfactory general anesthesia was induced by anesthesiology.    Standard anterolateral, anteromedial arthroscopy portals were obtained. The anteromedial portal was obtained with a spinal needle for localization under direct visualization with subsequent diagnostic findings.   Anteromedial and anterolateral chambers: moderate synovitis. The synovitis was debrided with a 4.5 mm full radius shaver through both the anteromedial and lateral portals.   Suprapatellar pouch and gutters: mild synovitis or debris. Patella chondral surface: Grade 0 Trochlear chondral surface: Grade 0 Patellofemoral tracking: Midline and level Medial meniscus: Complex tear of the posterior horn.  This was a horizontal tear with a short radial component on the superior leaflet in the posterior root region.   This was flipped vertically against the posterior femoral condyle on the medial side.  Radial tear did not go completely through from superior to inferior and did not go completely through from free edge to capsule..  Medial femoral condyle flexion bearing surface: Grade 0 Medial femoral condyle extension bearing surface: Grade 0 Medial tibial plateau: Grade 0 Anterior cruciate ligament:stable Posterior cruciate ligament:stable Lateral meniscus: Intact without tear.   Lateral femoral condyle flexion bearing surface: Grade 0 Lateral femoral condyle extension bearing surface: Grade 0 Lateral tibial plateau: Grade 0  Partial medial meniscectomy carried out with combination of motorized shaver as well as meniscal biter.  The unstable flipped fragment into the notch was removed and truncated at the root attachment.  The root attachment was still preserved.  Otherwise the tibial leaflet was resected with combination of biter and shaver.  After completion of synovectomy, diagnostic exam, and debridements as described, all compartments were checked and no residual debris remained. Hemostasis was achieved with the cautery wand. The portals were approximated with buried monocryl. All excess fluid was expressed from the joint.  Xeroform sterile gauze dressings were applied followed by Ace bandage and ice pack.   DISPOSITION: The patient was awakened from general anesthetic, extubated, taken to the recovery room in medically stable condition, no apparent complications. The patient may be weightbearing as tolerated to the operative lower extremity.  Range of motion of right knee as tolerated.

## 2023-03-12 NOTE — Anesthesia Postprocedure Evaluation (Signed)
 Anesthesia Post Note  Patient: Ralph Peters  Procedure(s) Performed: KNEE ARTHROSCOPY WITH PARTIAL MEDIAL MENISECTOMY (Right: Knee)     Patient location during evaluation: PACU Anesthesia Type: General Level of consciousness: awake and alert Pain management: pain level controlled Vital Signs Assessment: post-procedure vital signs reviewed and stable Respiratory status: spontaneous breathing, nonlabored ventilation and respiratory function stable Cardiovascular status: blood pressure returned to baseline and stable Postop Assessment: no apparent nausea or vomiting Anesthetic complications: no   No notable events documented.  Last Vitals:  Vitals:   03/12/23 1330 03/12/23 1345  BP: (!) 139/107 (!) 148/108  Pulse: 70 66  Resp: 11 16  Temp:  36.6 C  SpO2: 96% 95%    Last Pain:  Vitals:   03/12/23 1345  TempSrc:   PainSc: 3         RLE Motor Response: Purposeful movement (03/12/23 1345) RLE Sensation: Full sensation (03/12/23 1345)      Garnette FORBES Skillern

## 2023-03-12 NOTE — Transfer of Care (Signed)
 Immediate Anesthesia Transfer of Care Note  Patient: Ralph Peters  Procedure(s) Performed: KNEE ARTHROSCOPY WITH PARTIAL MEDIAL MENISECTOMY (Right: Knee)  Patient Location: PACU  Anesthesia Type:General  Level of Consciousness: sedated  Airway & Oxygen Therapy: Patient Spontanous Breathing and Patient connected to face mask oxygen  Post-op Assessment: Report given to RN and Post -op Vital signs reviewed and stable  Post vital signs: Reviewed and stable  Last Vitals:  Vitals Value Taken Time  BP 127/96 03/12/23 1235  Temp 36.6 C 03/12/23 1235  Pulse 60 03/12/23 1239  Resp 7 03/12/23 1239  SpO2 99 % 03/12/23 1239  Vitals shown include unfiled device data.  Last Pain:  Vitals:   03/12/23 1004  TempSrc: Tympanic  PainSc: 0-No pain      Patients Stated Pain Goal: 5 (03/12/23 1004)  Complications: No notable events documented.

## 2023-03-12 NOTE — Discharge Instructions (Addendum)
 Post-operative patient instructions  Knee Arthroscopy   Ice:  Place intermittent ice or cooler pack over your knee, 30 minutes on and 30 minutes off.  Continue this for the first 72 hours after surgery, then save ice for use after therapy sessions or on more active days.   Weight:  You may bear weight on your leg as your symptoms allow. DVT prevention: Perform ankle pumps as able throughout the day on the operative extremity.  Be mobile as possible with ambulation as able.  You should also take an 81 mg aspirin once per day x6 weeks. Crutches:  Use crutches (or walker) to assist in walking until told to discontinue by your physical therapist or physician. This will help to reduce pain. Motion:  Perform gentle knee motion as tolerated - this is gentle bending and straightening of the knee. Seated heel slides: you can start by sitting in a chair, remove your brace, and gently slide your heel back on the floor - allowing your knee to bend. Have someone help you straighten your knee (or use your other leg/foot hooked under your ankle.  Dressing:  Perform 1st dressing change at 3 days postoperative. A moderate amount of blood tinged drainage is to be expected.  So if you bleed through the dressing on the first or second day or if you have fevers, it is fine to change the dressing/check the wounds early and redress wound. Elevate your leg.  If it bleeds through again, or if the incisions are leaking frank blood, please call the office. May change dressing every 1-2 days thereafter to help watch wounds. Can purchase Tegderm (or 74M Nexcare) water resistant dressings at local pharmacy / [facility]. Shower:  Light shower is ok after 3 days.  Please take shower, NO bath. Recover with gauze and ace wrap to help keep wounds protected.   Pain medication:  A narcotic pain medication has been prescribed.  Take as directed.  Typically you need narcotic pain medication more regularly during the first 3 to 5 days after surgery.   Decrease your use of the medication as the pain improves.  Narcotics can sometimes cause constipation, even after a few doses.  If you have problems with constipation, you can take an over the counter stool softener or light laxative.  If you have persistent problems, please notify your physician's office. Physical therapy: Additional activity guidelines to be provided by your physician or physical therapist at follow-up visits.  Driving: Do not recommend driving x 1-2 weeks post surgical, especially if surgery performed on right side. Should not drive while taking narcotic pain medications. It typically takes at least 2 weeks to restore sufficient neuromuscular function for normal reaction times for driving safety.  Call [phone] for questions or problems. Evenings you will be forwarded to the hospital operator.  Ask for the orthopaedic physician on call. Please call if you experience:    Redness, foul smelling, or persistent drainage from the surgical site  worsening knee pain and swelling not responsive to medication  any calf pain and or swelling of the lower leg  temperatures greater than 101.5 F other questions or concerns   Thank you for allowing us  to be a part of your care   Post Anesthesia Home Care Instructions  Activity: Get plenty of rest for the remainder of the day. A responsible individual must stay with you for 24 hours following the procedure.  For the next 24 hours, DO NOT: -Drive a car -Advertising copywriter -Drink alcoholic beverages -  Take any medication unless instructed by your physician -Make any legal decisions or sign important papers.  Meals: Start with liquid foods such as gelatin or soup. Progress to regular foods as tolerated. Avoid greasy, spicy, heavy foods. If nausea and/or vomiting occur, drink only clear liquids until the nausea and/or vomiting subsides. Call your physician if vomiting continues.  Special Instructions/Symptoms: Your throat may feel  dry or sore from the anesthesia or the breathing tube placed in your throat during surgery. If this causes discomfort, gargle with warm salt water. The discomfort should disappear within 24 hours.

## 2023-03-12 NOTE — Anesthesia Procedure Notes (Signed)
 Procedure Name: LMA Insertion Date/Time: 03/12/2023 11:59 AM  Performed by: Julieanne Fairy BROCKS, CRNAPre-anesthesia Checklist: Patient identified, Emergency Drugs available, Suction available and Patient being monitored Patient Re-evaluated:Patient Re-evaluated prior to induction Oxygen Delivery Method: Circle system utilized Preoxygenation: Pre-oxygenation with 100% oxygen Induction Type: IV induction Ventilation: Mask ventilation without difficulty LMA: LMA inserted LMA Size: 5.0 Number of attempts: 1 Airway Equipment and Method: Bite block Placement Confirmation: positive ETCO2 Tube secured with: Tape Dental Injury: Teeth and Oropharynx as per pre-operative assessment

## 2023-03-13 ENCOUNTER — Encounter (HOSPITAL_BASED_OUTPATIENT_CLINIC_OR_DEPARTMENT_OTHER): Payer: Self-pay | Admitting: Orthopedic Surgery

## 2023-03-25 DIAGNOSIS — D125 Benign neoplasm of sigmoid colon: Secondary | ICD-10-CM | POA: Diagnosis not present

## 2023-03-25 DIAGNOSIS — Z1211 Encounter for screening for malignant neoplasm of colon: Secondary | ICD-10-CM | POA: Diagnosis not present

## 2023-03-25 DIAGNOSIS — K5730 Diverticulosis of large intestine without perforation or abscess without bleeding: Secondary | ICD-10-CM | POA: Diagnosis not present

## 2023-03-25 DIAGNOSIS — K648 Other hemorrhoids: Secondary | ICD-10-CM | POA: Diagnosis not present

## 2023-04-13 DIAGNOSIS — M25561 Pain in right knee: Secondary | ICD-10-CM | POA: Diagnosis not present

## 2023-04-13 DIAGNOSIS — M6281 Muscle weakness (generalized): Secondary | ICD-10-CM | POA: Diagnosis not present

## 2023-04-15 DIAGNOSIS — M25561 Pain in right knee: Secondary | ICD-10-CM | POA: Diagnosis not present

## 2023-04-15 DIAGNOSIS — M6281 Muscle weakness (generalized): Secondary | ICD-10-CM | POA: Diagnosis not present

## 2023-04-20 DIAGNOSIS — M25561 Pain in right knee: Secondary | ICD-10-CM | POA: Diagnosis not present

## 2023-04-20 DIAGNOSIS — M6281 Muscle weakness (generalized): Secondary | ICD-10-CM | POA: Diagnosis not present

## 2023-04-23 DIAGNOSIS — M25561 Pain in right knee: Secondary | ICD-10-CM | POA: Diagnosis not present

## 2023-04-23 DIAGNOSIS — M6281 Muscle weakness (generalized): Secondary | ICD-10-CM | POA: Diagnosis not present

## 2023-04-26 DIAGNOSIS — M6281 Muscle weakness (generalized): Secondary | ICD-10-CM | POA: Diagnosis not present

## 2023-04-26 DIAGNOSIS — M25561 Pain in right knee: Secondary | ICD-10-CM | POA: Diagnosis not present

## 2023-12-04 DIAGNOSIS — H6123 Impacted cerumen, bilateral: Secondary | ICD-10-CM | POA: Diagnosis not present

## 2024-01-25 DIAGNOSIS — R7303 Prediabetes: Secondary | ICD-10-CM | POA: Diagnosis not present

## 2024-01-25 DIAGNOSIS — B351 Tinea unguium: Secondary | ICD-10-CM | POA: Diagnosis not present

## 2024-01-25 DIAGNOSIS — Z789 Other specified health status: Secondary | ICD-10-CM | POA: Diagnosis not present

## 2024-01-25 DIAGNOSIS — E7849 Other hyperlipidemia: Secondary | ICD-10-CM | POA: Diagnosis not present

## 2024-01-25 DIAGNOSIS — Z0000 Encounter for general adult medical examination without abnormal findings: Secondary | ICD-10-CM | POA: Diagnosis not present
